# Patient Record
Sex: Male | Born: 2000 | Race: White | Hispanic: No | Marital: Single | Smoking: Never smoker
Health system: Southern US, Community
[De-identification: ages and names within clinical notes are randomized; demographics above are authoritative.]

## PROBLEM LIST (undated history)

## (undated) DIAGNOSIS — J45909 Unspecified asthma, uncomplicated: Secondary | ICD-10-CM

## (undated) HISTORY — PX: FOREARM FRACTURE SURGERY: SHX649

## (undated) HISTORY — PX: KNEE SURGERY: SHX244

---
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
## 2019-08-05 ENCOUNTER — Emergency Department (HOSPITAL_COMMUNITY)
Admission: EM | Admit: 2019-08-05 | Discharge: 2019-08-05 | Disposition: A | Discharge status: Home / Self Care | Payer: Worker's Compensation | Attending: Emergency Medicine | Admitting: Emergency Medicine

## 2019-08-05 ENCOUNTER — Encounter (HOSPITAL_COMMUNITY): Payer: Self-pay | Admitting: Emergency Medicine

## 2019-08-05 ENCOUNTER — Other Ambulatory Visit: Payer: Self-pay

## 2019-08-05 ENCOUNTER — Emergency Department (HOSPITAL_COMMUNITY): Payer: Worker's Compensation

## 2019-08-05 DIAGNOSIS — W01198A Fall on same level from slipping, tripping and stumbling with subsequent striking against other object, initial encounter: Secondary | ICD-10-CM | POA: Diagnosis not present

## 2019-08-05 DIAGNOSIS — S81812A Laceration without foreign body, left lower leg, initial encounter: Secondary | ICD-10-CM | POA: Diagnosis present

## 2019-08-05 DIAGNOSIS — Z23 Encounter for immunization: Secondary | ICD-10-CM | POA: Diagnosis not present

## 2019-08-05 DIAGNOSIS — Y9289 Other specified places as the place of occurrence of the external cause: Secondary | ICD-10-CM | POA: Insufficient documentation

## 2019-08-05 DIAGNOSIS — Y990 Civilian activity done for income or pay: Secondary | ICD-10-CM | POA: Insufficient documentation

## 2019-08-05 DIAGNOSIS — Y9301 Activity, walking, marching and hiking: Secondary | ICD-10-CM | POA: Diagnosis not present

## 2019-08-05 MED ORDER — TETANUS-DIPHTH-ACELL PERTUSSIS 5-2.5-18.5 LF-MCG/0.5 IM SUSP
0.5000 mL | Freq: Once | INTRAMUSCULAR | Status: AC
  Administered 2019-08-05: 22:00:00 0.5 mL via INTRAMUSCULAR
  Filled 2019-08-05: qty 0.5

## 2019-08-05 MED ORDER — IBUPROFEN 800 MG PO TABS
800.0000 mg | ORAL_TABLET | Freq: Once | ORAL | Status: AC
  Administered 2019-08-05: 22:00:00 800 mg via ORAL
  Filled 2019-08-05: qty 1

## 2019-08-05 MED ORDER — LIDOCAINE-EPINEPHRINE (PF) 2 %-1:200000 IJ SOLN
10.0000 mL | Freq: Once | INTRAMUSCULAR | Status: AC
  Administered 2019-08-05: 10 mL
  Filled 2019-08-05: qty 10

## 2019-08-05 NOTE — ED Provider Notes (Signed)
[facility] EMERGENCY DEPARTMENT Provider Note   CSN: [id number] Arrival date & time: 08/05/19  1957     History Chief Complaint  Patient presents with  . Laceration    Jose Brown is a 19 y.o. male with a past medical history significant for asthma who presents to the ED for evaluation of laceration on his left shin that occurred roughly 2 hours prior to arrival.  Patient states he was walking in the kitchen when he tripped and fell and cut his left shin on his kitchen tile.  Patient denies head injury and loss of consciousness.  No treatment prior to arrival.  Unsure when his last tetanus shot was.  Admits to numbness/tingling around the laceration, but denies numbness and tingling down left lower extremity. No other injuries from fall.   History obtained from patient and past medical records. No interpreter used during encounter.      Past Medical History:  Diagnosis Date  . Asthma    as young child-off singulair since age 39.    Patient Active Problem List   Diagnosis Date Noted  . Right tibial fracture 02/10/2013  . Closed fracture of upper end of tibia 01/30/2013    Past Surgical History:  Procedure Laterality Date  . FOREARM FRACTURE SURGERY Right   . KNEE SURGERY         Family History  Problem Relation Age of Onset  . Diabetes Maternal Grandfather   . Hyperlipidemia Maternal Grandfather   . Hypertension Maternal Grandfather   . Stroke Maternal Grandfather     Social History   Tobacco Use  . Smoking status: Never Smoker  . Smokeless tobacco: Never Used  Substance Use Topics  . Alcohol use: No    Comment: occ  . Drug use: No    Home Medications Prior to Admission medications   Medication Sig Start Date End Date Taking? Authorizing Provider  albuterol (PROVENTIL HFA;VENTOLIN HFA) 108 (90 Base) MCG/ACT inhaler Inhale 2 puffs into the lungs daily as needed.    [provider]  AMINO ACIDS PO Take 1 tablet by mouth daily.    [provider]  ibuprofen (ADVIL,MOTRIN) 800 MG tablet Take 1 tablet (800 mg total) by mouth 3 (three) times daily. 02/14/16   Elson Areas, PA-C  methocarbamol (ROBAXIN) 500 MG tablet Take 1 tablet (500 mg total) by mouth 2 (two) times daily. 02/14/16   Elson Areas, PA-C  Multiple Vitamins-Minerals (MULTIVITAMIN ADULT PO) Take 1 tablet by mouth daily.    [provider]    Allergies    Patient has no known allergies.  Review of Systems   Review of Systems  Constitutional: Negative for chills and fever.  Musculoskeletal: Negative for back pain and neck pain.  Skin: Positive for color change and wound.  Neurological: Positive for numbness.  All other systems reviewed and are negative.   Physical Exam Updated Vital Signs BP 135/75 (BP Location: Right Arm)   Pulse 60   Temp 98.3 F (36.8 C) (Oral)   Resp 16   Ht 6' (1.829 m)   Wt 106.6 kg   SpO2 99%   BMI 31.87 kg/m   Physical Exam Vitals and nursing note reviewed.  Constitutional:      General: He is not in acute distress.    Appearance: He is not ill-appearing.  HENT:     Head: Normocephalic.  Eyes:     Conjunctiva/sclera: Conjunctivae normal.  Cardiovascular:     Rate and Rhythm:  Normal rate and regular rhythm.     Pulses: Normal pulses.     Heart sounds: Normal heart sounds. No murmur. No friction rub. No gallop.   Pulmonary:     Effort: Pulmonary effort is normal.     Breath sounds: Normal breath sounds.  Abdominal:     General: Abdomen is flat. There is no distension.     Palpations: Abdomen is soft.     Tenderness: There is no abdominal tenderness. There is no guarding or rebound.  Musculoskeletal:     Cervical back: Neck supple.     Comments: Full range of motion of left knee and left ankle.  Left lower extremity neurovascularly intact.  Skin:    Comments: 1.5 cm laceration on anterior aspect of left shin.  Hemostasis achieved.  See photo below.  Neurological:     General: No focal deficit  present.     Mental Status: He is alert.  Psychiatric:        Mood and Affect: Mood normal.        Behavior: Behavior normal.       ED Results / Procedures / Treatments   Labs (all labs ordered are listed, but only abnormal results are displayed) Labs Reviewed - No data to display  EKG None  Radiology DG Tibia/Fibula Left  Result Date: 08/05/2019 CLINICAL DATA:  Laceration fall EXAM: LEFT TIBIA AND FIBULA - 2 VIEW COMPARISON:  None. FINDINGS: no fracture or malalignment. No radiopaque foreign body in the soft tissues. Gas within the soft tissues lateral aspect of the proximal lower leg consistent with history of laceration. IMPRESSION: No acute osseous abnormality. Electronically Signed   By: Jasmine Pang M.D.   On: 08/05/2019 21:59    Procedures .Marland KitchenLaceration Repair  Date/Time: 08/05/2019 10:26 PM Performed by: Mannie Stabile, PA-C Authorized by: Mannie Stabile, PA-C   Consent:    Consent obtained:  Verbal   Consent given by:  Patient   Risks discussed:  Infection, need for additional repair, pain, poor cosmetic result and poor wound healing   Alternatives discussed:  No treatment and delayed treatment Universal protocol:    Procedure explained and questions answered to patient or proxy's satisfaction: yes     Relevant documents present and verified: yes     Test results available and properly labeled: yes     Imaging studies available: yes     Required blood products, implants, devices, and special equipment available: yes     Site/side marked: yes     Immediately prior to procedure, a time out was called: yes     Patient identity confirmed:  Verbally with patient Anesthesia (see MAR for exact dosages):    Anesthesia method:  Local infiltration   Local anesthetic:  Lidocaine 2% WITH epi Laceration details:    Location:  Leg   Leg location:  L lower leg   Length (cm):  1.5   Depth (mm):  4 Repair type:    Repair type:  Simple Pre-procedure details:     Preparation:  Patient was prepped and draped in usual sterile fashion and imaging obtained to evaluate for foreign bodies Exploration:    Hemostasis achieved with:  Direct pressure and epinephrine   Wound exploration: wound explored through full range of motion and entire depth of wound probed and visualized     Wound extent: no areolar tissue violation noted, no fascia violation noted, no foreign bodies/material noted, no muscle damage noted, no nerve damage noted, no tendon damage  noted, no underlying fracture noted and no vascular damage noted   Treatment:    Area cleansed with:  Saline   Amount of cleaning:  Standard   Irrigation solution:  Sterile saline   Irrigation volume:  30   Irrigation method:  Syringe   Visualized foreign bodies/material removed: no   Skin repair:    Repair method:  Sutures   Suture size:  4-0   Suture material:  Prolene   Suture technique:  Simple interrupted   Number of sutures:  3 Approximation:    Approximation:  Close Post-procedure details:    Dressing:  Non-adherent dressing   Patient tolerance of procedure:  Tolerated well, no immediate complications   (including critical care time)  Medications Ordered in ED Medications  ibuprofen (ADVIL) tablet 800 mg (800 mg Oral Given 08/05/19 2200)  Tdap (BOOSTRIX) injection 0.5 mL (0.5 mLs Intramuscular Given 08/05/19 2201)  lidocaine-EPINEPHrine (XYLOCAINE W/EPI) 2 %-1:200000 (PF) injection 10 mL (10 mLs Infiltration Given 08/05/19 2204)    ED Course  I have reviewed the triage vital signs and the nursing notes.  Pertinent labs & imaging results that were available during my care of the patient were reviewed by me and considered in my medical decision making (see chart for details).    MDM Rules/Calculators/A&P                     19 year old male presents to the ED for evaluation of a laceration on his left shin after a mechanical fall that occurred just prior to arrival.  Vitals all within normal  limits.  Patient no acute distress and non-ill-appearing.  1.5 cm laceration on anterior aspect of left lower extremity.  Left lower extremity neurovascularly intact. Pressure irrigation performed. Wound explored and base of wound visualized in a bloodless field without evidence of foreign body.  X-ray personally reviewed which is negative for underlying bony fractures. Laceration occurred < 8 hours prior to repair which was well tolerated. Tdap updated.  Pt has no comorbidities to effect normal wound healing. Pt discharged  without antibiotics.  Discussed suture home care with patient and answered questions. Pt to follow-up for wound check and suture removal in 7 days; they are to return to the ED sooner for signs of infection. Pt is hemodynamically stable with no complaints prior to dc. Strict ED precautions discussed with patient. Patient states understanding and agrees to plan. Patient discharged home in no acute distress and stable vitals. Final Clinical Impression(s) / ED Diagnoses Final diagnoses:  Laceration of left lower extremity, initial encounter    Rx / DC Orders ED Discharge Orders    None       Karie Kirks 08/05/19 2228    Margette Fast, MD 08/06/19 1157

## 2019-08-05 NOTE — ED Triage Notes (Signed)
Pt c/o of a left leg shin laceration that happened today when he fell in the kitchen. Bleeding controled and swelling noted.

## 2019-08-05 NOTE — Discharge Instructions (Addendum)
As discussed, you will need your sutures removed in 10 to 14 days.  Keep area clean.  You may take over-the-counter ibuprofen or Tylenol as needed for pain.  Return to the ER for new or worsening symptoms.
